# Patient Record
Sex: Female | Born: 1990 | Race: White | Hispanic: No | Marital: Single | Smoking: Never smoker
Health system: Southern US, Community
[De-identification: ages and names within clinical notes are randomized; demographics above are authoritative.]

## PROBLEM LIST (undated history)

## (undated) DIAGNOSIS — I10 Essential (primary) hypertension: Secondary | ICD-10-CM

## (undated) HISTORY — DX: Essential (primary) hypertension: I10

---
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
## 2011-04-12 ENCOUNTER — Encounter: Payer: Self-pay | Admitting: *Deleted

## 2011-06-02 ENCOUNTER — Other Ambulatory Visit: Payer: Self-pay | Admitting: "Endocrinology

## 2018-05-10 DIAGNOSIS — Q213 Tetralogy of Fallot: Secondary | ICD-10-CM | POA: Diagnosis not present

## 2018-06-23 DIAGNOSIS — Q220 Pulmonary valve atresia: Secondary | ICD-10-CM | POA: Diagnosis not present

## 2018-06-23 DIAGNOSIS — Q213 Tetralogy of Fallot: Secondary | ICD-10-CM | POA: Diagnosis not present

## 2018-06-23 DIAGNOSIS — F419 Anxiety disorder, unspecified: Secondary | ICD-10-CM | POA: Diagnosis not present

## 2018-06-25 DIAGNOSIS — I499 Cardiac arrhythmia, unspecified: Secondary | ICD-10-CM | POA: Diagnosis not present

## 2018-07-25 DIAGNOSIS — J069 Acute upper respiratory infection, unspecified: Secondary | ICD-10-CM | POA: Diagnosis not present

## 2018-08-03 ENCOUNTER — Ambulatory Visit: Payer: BLUE CROSS/BLUE SHIELD | Admitting: Psychology

## 2018-08-05 ENCOUNTER — Encounter

## 2018-08-05 ENCOUNTER — Ambulatory Visit (HOSPITAL_COMMUNITY): Payer: Self-pay | Admitting: Psychiatry

## 2018-08-22 ENCOUNTER — Ambulatory Visit: Payer: BLUE CROSS/BLUE SHIELD | Admitting: Psychology

## 2018-09-08 DIAGNOSIS — L820 Inflamed seborrheic keratosis: Secondary | ICD-10-CM | POA: Diagnosis not present

## 2018-09-08 DIAGNOSIS — D224 Melanocytic nevi of scalp and neck: Secondary | ICD-10-CM | POA: Diagnosis not present

## 2018-09-08 DIAGNOSIS — L918 Other hypertrophic disorders of the skin: Secondary | ICD-10-CM | POA: Diagnosis not present

## 2018-09-08 DIAGNOSIS — B351 Tinea unguium: Secondary | ICD-10-CM | POA: Diagnosis not present

## 2018-10-17 ENCOUNTER — Ambulatory Visit: Payer: Self-pay | Admitting: Nurse Practitioner

## 2018-10-17 VITALS — BP 98/72 | HR 99 | Temp 99.0°F | Wt 86.0 lb

## 2018-10-17 DIAGNOSIS — Z20828 Contact with and (suspected) exposure to other viral communicable diseases: Secondary | ICD-10-CM

## 2018-10-17 DIAGNOSIS — R6889 Other general symptoms and signs: Secondary | ICD-10-CM

## 2018-10-17 LAB — POCT INFLUENZA A/B
Influenza A, POC: NEGATIVE
Influenza B, POC: NEGATIVE

## 2018-10-17 MED ORDER — OSELTAMIVIR PHOSPHATE 75 MG PO CAPS: 75.0000 mg | ORAL_CAPSULE | Freq: Two times a day (BID) | ORAL | 0 refills | Status: AC

## 2018-10-17 NOTE — Patient Instructions (Signed)
Influenza-Like Symptoms, Adult -Take medication as prescribed. -Ibuprofen or Tylenol for pain, fever, or general discomfort. -Increase fluids. -Sleep elevated on at least 2 pillows at bedtime to help with cough. -Use a humidifier or vaporizer when at home and during sleep to help with cough. -May use a teaspoon of honey or over-the-counter cough drops to help with cough. -Follow-up with PCP if symptoms do not improve.   Influenza, more commonly known as "the flu," is a viral infection that mainly affects the respiratory tract. The respiratory tract includes organs that help you breathe, such as the lungs, nose, and throat. The flu causes many symptoms similar to the common cold along with high fever and body aches. The flu spreads easily from person to person (is contagious). Getting a flu shot (influenza vaccination) every year is the best way to prevent the flu. What are the causes? This condition is caused by the influenza virus. You can get the virus by:  Breathing in droplets that are in the air from an infected person's cough or sneeze.  Touching something that has been exposed to the virus (has been contaminated) and then touching your mouth, nose, or eyes. What increases the risk? The following factors may make you more likely to get the flu:  Not washing or sanitizing your hands often.  Having close contact with many people during cold and flu season.  Touching your mouth, eyes, or nose without first washing or sanitizing your hands.  Not getting a yearly (annual) flu shot. You may have a higher risk for the flu, including serious problems such as a lung infection (pneumonia), if you:  Are older than 65.  Are pregnant.  Have a weakened disease-fighting system (immune system). You may have a weakened immune system if you: ? Have HIV or AIDS. ? Are undergoing chemotherapy. ? Are taking medicines that reduce (suppress) the activity of your immune system.  Have a long-term  (chronic) illness, such as heart disease, kidney disease, diabetes, or lung disease.  Have a liver disorder.  Are severely overweight (morbidly obese).  Have anemia. This is a condition that affects your red blood cells.  Have asthma. What are the signs or symptoms? Symptoms of this condition usually begin suddenly and last 4-14 days. They may include:  Fever and chills.  Headaches, body aches, or muscle aches.  Sore throat.  Cough.  Runny or stuffy (congested) nose.  Chest discomfort.  Poor appetite.  Weakness or fatigue.  Dizziness.  Nausea or vomiting. How is this diagnosed? This condition may be diagnosed based on:  Your symptoms and medical history.  A physical exam.  Swabbing your nose or throat and testing the fluid for the influenza virus. How is this treated? If the flu is diagnosed early, you can be treated with medicine that can help reduce how severe the illness is and how long it lasts (antiviral medicine). This may be given by mouth (orally) or through an IV. Taking care of yourself at home can help relieve symptoms. Your health care provider may recommend:  Taking over-the-counter medicines.  Drinking plenty of fluids. In many cases, the flu goes away on its own. If you have severe symptoms or complications, you may be treated in a hospital. Follow these instructions at home: Activity  Rest as needed and get plenty of sleep.  Stay home from work or school as told by your health care provider. Unless you are visiting your health care provider, avoid leaving home until your fever has been  gone for 24 hours without taking medicine. Eating and drinking  Take an oral rehydration solution (ORS). This is a drink that is sold at pharmacies and retail stores.  Drink enough fluid to keep your urine pale yellow.  Drink clear fluids in small amounts as you are able. Clear fluids include water, ice chips, diluted fruit juice, and low-calorie sports  drinks.  Eat bland, easy-to-digest foods in small amounts as you are able. These foods include bananas, applesauce, rice, lean meats, toast, and crackers.  Avoid drinking fluids that contain a lot of sugar or caffeine, such as energy drinks, regular sports drinks, and soda.  Avoid alcohol.  Avoid spicy or fatty foods. General instructions      Take over-the-counter and prescription medicines only as told by your health care provider.  Use a cool mist humidifier to add humidity to the air in your home. This can make it easier to breathe.  Cover your mouth and nose when you cough or sneeze.  Wash your hands with soap and water often, especially after you cough or sneeze. If soap and water are not available, use alcohol-based hand sanitizer.  Keep all follow-up visits as told by your health care provider. This is important. How is this prevented?   Get an annual flu shot. You may get the flu shot in late summer, fall, or winter. Ask your health care provider when you should get your flu shot.  Avoid contact with people who are sick during cold and flu season. This is generally fall and winter. Contact a health care provider if:  You develop new symptoms.  You have: ? Chest pain. ? Diarrhea. ? A fever.  Your cough gets worse.  You produce more mucus.  You feel nauseous or you vomit. Get help right away if:  You develop shortness of breath or difficulty breathing.  Your skin or nails turn a bluish color.  You have severe pain or stiffness in your neck.  You develop a sudden headache or sudden pain in your face or ear.  You cannot eat or drink without vomiting. Summary  Influenza, more commonly known as "the flu," is a viral infection that primarily affects your respiratory tract.  Symptoms of the flu usually begin suddenly and last 4-14 days.  Getting an annual flu shot is the best way to prevent getting the flu.  Stay home from work or school as told by your  health care provider. Unless you are visiting your health care provider, avoid leaving home until your fever has been gone for 24 hours without taking medicine.  Keep all follow-up visits as told by your health care provider. This is important. This information is not intended to replace advice given to you by your health care provider. Make sure you discuss any questions you have with your health care provider. Document Released: 10/08/2000 Document Revised: 03/29/2018 Document Reviewed: 03/29/2018 Elsevier Interactive Patient Education  2019 [employer]

## 2018-10-17 NOTE — Addendum Note (Signed)
Addended by: Arnette FeltsRSETT, Morrisa Aldaba M on: 10/17/2018 11:46 AM   Modules accepted: Orders

## 2018-10-17 NOTE — Progress Notes (Signed)
Subjective:  Courtney Mckay is a 27 y.o. female who presents for evaluation of influenza like symptoms.  Symptoms include achiness, cough described as nonproductive, fever: fevers up to 100.4 degrees, sore throat and headache.  Onset of symptoms was 1 day ago, and has been unchanged since that time.  Treatment to date:  Dayquil.  High risk factors for influenza complications:  co-morbid illness and patient has a history of Tetralogy of Fallot and VSD.  The following portions of the patient's history were reviewed and updated as appropriate:  allergies, current medications and past medical history.  Constitutional: positive for chills, fatigue, fevers and malaise, negative for anorexia, sweats and weight loss Eyes: negative Ears, nose, mouth, throat, and face: positive for nasal congestion and sore throat, negative for ear drainage, earaches and hoarseness Respiratory: positive for cough, negative for asthma, chronic bronchitis, dyspnea on exertion, sputum, stridor and wheezing Cardiovascular: negative Gastrointestinal: negative Neurological: positive for headaches, negative for coordination problems, dizziness, paresthesia, tremors, vertigo and weakness Objective:  BP 98/72 (BP Location: Right Arm, Patient Position: Sitting)   Pulse 99   Temp 99 F (37.2 C) (Oral)   Wt 86 lb (39 kg)   SpO2 99%  General appearance: alert, cooperative and no distress Head: Normocephalic, without obvious abnormality, atraumatic Eyes: conjunctivae/corneas clear. PERRL, EOM's intact. Fundi benign. Ears: normal TM's and external ear canals both ears Nose: no discharge, moderate congestion, turbinates swollen, inflamed, no sinus tenderness Throat: abnormal findings: moderate oropharyngeal erythema Lungs: clear to auscultation bilaterally Heart: regular rate and rhythm, S1, S2 normal, no murmur, click, rub or gallop Abdomen: soft, non-tender; bowel sounds normal; no masses,  no organomegaly Pulses: 2+ and  symmetric Skin: +pallor (due to underlying heart disease), no lesions, texture and turgor normal Lymph nodes: cervical and submandibular nodes normal Neurologic: Grossly normal    Assessment:  influenza like syndrome    Plan:   Exam findings, diagnosis etiology and medication use and indications reviewed with patient. Follow- Up and discharge instructions provided. No emergent/urgent issues found on exam.  Based on the patient's past medical history, and symptoms, and comorbidity of heart disease, we will go ahead and treat prophylactically with Tamiflu.  Patient was in agreement with this treatment plan.  Patient will also continue symptomatic treatment.  Patient education was provided. Patient verbalized understanding of information provided and agrees with plan of care (POC), all questions answered. The patient is advised to call or return to clinic if condition does not see an improvement in symptoms, or to seek the care of the closest emergency department if condition worsens with the above plan.   1. Exposure to influenza  - oseltamivir (TAMIFLU) 75 MG capsule; Take 1 capsule (75 mg total) by mouth 2 (two) times daily for 5 days.  Dispense: 10 capsule; Refill: 0 -Take medication as prescribed. -Ibuprofen or Tylenol for pain, fever, or general discomfort. -Increase fluids. -Sleep elevated on at least 2 pillows at bedtime to help with cough. -Use a humidifier or vaporizer when at home and during sleep to help with cough. -May use a teaspoon of honey or over-the-counter cough drops to help with cough. -Follow-up with PCP if symptoms do not improve.  2. Influenza-like symptoms  - oseltamivir (TAMIFLU) 75 MG capsule; Take 1 capsule (75 mg total) by mouth 2 (two) times daily for 5 days.  Dispense: 10 capsule; Refill: 0 -Take medication as prescribed. -Ibuprofen or Tylenol for pain, fever, or general discomfort. -Increase fluids. -Sleep elevated on at least 2 pillows  at bedtime to help  with cough. -Use a humidifier or vaporizer when at home and during sleep to help with cough. -May use a teaspoon of honey or over-the-counter cough drops to help with cough. -Follow-up with PCP if symptoms do not improve.

## 2018-11-17 DIAGNOSIS — F411 Generalized anxiety disorder: Secondary | ICD-10-CM | POA: Diagnosis not present

## 2018-12-19 DIAGNOSIS — F4011 Social phobia, generalized: Secondary | ICD-10-CM | POA: Diagnosis not present

## 2019-03-12 DIAGNOSIS — R5381 Other malaise: Secondary | ICD-10-CM | POA: Diagnosis not present

## 2019-04-24 DIAGNOSIS — F4011 Social phobia, generalized: Secondary | ICD-10-CM | POA: Diagnosis not present

## 2019-05-03 DIAGNOSIS — Q213 Tetralogy of Fallot: Secondary | ICD-10-CM | POA: Diagnosis not present

## 2019-05-03 DIAGNOSIS — Q249 Congenital malformation of heart, unspecified: Secondary | ICD-10-CM | POA: Diagnosis not present

## 2019-05-03 DIAGNOSIS — D821 Di George's syndrome: Secondary | ICD-10-CM | POA: Diagnosis not present

## 2019-05-03 DIAGNOSIS — R011 Cardiac murmur, unspecified: Secondary | ICD-10-CM | POA: Diagnosis not present

## 2019-05-03 DIAGNOSIS — I77819 Aortic ectasia, unspecified site: Secondary | ICD-10-CM | POA: Diagnosis not present

## 2019-05-03 DIAGNOSIS — J9811 Atelectasis: Secondary | ICD-10-CM | POA: Diagnosis not present

## 2019-05-03 DIAGNOSIS — Q2547 Right aortic arch: Secondary | ICD-10-CM | POA: Diagnosis not present

## 2019-05-03 DIAGNOSIS — Z9889 Other specified postprocedural states: Secondary | ICD-10-CM | POA: Diagnosis not present

## 2019-05-17 DIAGNOSIS — F4011 Social phobia, generalized: Secondary | ICD-10-CM | POA: Diagnosis not present

## 2019-05-30 DIAGNOSIS — F411 Generalized anxiety disorder: Secondary | ICD-10-CM | POA: Diagnosis not present

## 2019-06-06 DIAGNOSIS — F411 Generalized anxiety disorder: Secondary | ICD-10-CM | POA: Diagnosis not present

## 2019-06-13 DIAGNOSIS — F411 Generalized anxiety disorder: Secondary | ICD-10-CM | POA: Diagnosis not present

## 2019-06-19 DIAGNOSIS — F411 Generalized anxiety disorder: Secondary | ICD-10-CM | POA: Diagnosis not present

## 2019-06-20 DIAGNOSIS — F4011 Social phobia, generalized: Secondary | ICD-10-CM | POA: Diagnosis not present

## 2019-07-03 DIAGNOSIS — F411 Generalized anxiety disorder: Secondary | ICD-10-CM | POA: Diagnosis not present

## 2019-07-19 DIAGNOSIS — F411 Generalized anxiety disorder: Secondary | ICD-10-CM | POA: Diagnosis not present

## 2019-07-26 DIAGNOSIS — F411 Generalized anxiety disorder: Secondary | ICD-10-CM | POA: Diagnosis not present

## 2019-08-02 DIAGNOSIS — Z681 Body mass index (BMI) 19 or less, adult: Secondary | ICD-10-CM | POA: Diagnosis not present

## 2019-08-02 DIAGNOSIS — Z01419 Encounter for gynecological examination (general) (routine) without abnormal findings: Secondary | ICD-10-CM | POA: Diagnosis not present

## 2019-08-02 DIAGNOSIS — Z23 Encounter for immunization: Secondary | ICD-10-CM | POA: Diagnosis not present

## 2019-08-15 DIAGNOSIS — F411 Generalized anxiety disorder: Secondary | ICD-10-CM | POA: Diagnosis not present

## 2019-10-04 DIAGNOSIS — F411 Generalized anxiety disorder: Secondary | ICD-10-CM | POA: Diagnosis not present

## 2019-11-15 DIAGNOSIS — F4011 Social phobia, generalized: Secondary | ICD-10-CM | POA: Diagnosis not present

## 2019-12-18 DIAGNOSIS — F411 Generalized anxiety disorder: Secondary | ICD-10-CM | POA: Diagnosis not present

## 2020-03-24 DIAGNOSIS — F4011 Social phobia, generalized: Secondary | ICD-10-CM | POA: Diagnosis not present

## 2020-05-27 DIAGNOSIS — F411 Generalized anxiety disorder: Secondary | ICD-10-CM | POA: Diagnosis not present

## 2020-06-18 DIAGNOSIS — L820 Inflamed seborrheic keratosis: Secondary | ICD-10-CM | POA: Diagnosis not present

## 2020-06-25 DIAGNOSIS — Q220 Pulmonary valve atresia: Secondary | ICD-10-CM | POA: Diagnosis not present

## 2020-06-25 DIAGNOSIS — D821 Di George's syndrome: Secondary | ICD-10-CM | POA: Diagnosis not present

## 2020-06-25 DIAGNOSIS — R002 Palpitations: Secondary | ICD-10-CM | POA: Diagnosis not present

## 2020-07-08 DIAGNOSIS — R002 Palpitations: Secondary | ICD-10-CM | POA: Diagnosis not present

## 2020-09-14 DIAGNOSIS — F4011 Social phobia, generalized: Secondary | ICD-10-CM | POA: Diagnosis not present

## 2020-10-22 DIAGNOSIS — U071 COVID-19: Secondary | ICD-10-CM | POA: Diagnosis not present

## 2020-10-22 DIAGNOSIS — R059 Cough, unspecified: Secondary | ICD-10-CM | POA: Diagnosis not present

## 2020-10-22 DIAGNOSIS — R509 Fever, unspecified: Secondary | ICD-10-CM | POA: Diagnosis not present

## 2020-10-22 DIAGNOSIS — J029 Acute pharyngitis, unspecified: Secondary | ICD-10-CM | POA: Diagnosis not present

## 2021-01-13 DIAGNOSIS — Z01419 Encounter for gynecological examination (general) (routine) without abnormal findings: Secondary | ICD-10-CM | POA: Diagnosis not present

## 2021-01-13 DIAGNOSIS — Z681 Body mass index (BMI) 19 or less, adult: Secondary | ICD-10-CM | POA: Diagnosis not present

## 2021-02-11 DIAGNOSIS — Q213 Tetralogy of Fallot: Secondary | ICD-10-CM | POA: Diagnosis not present

## 2021-02-11 DIAGNOSIS — Q249 Congenital malformation of heart, unspecified: Secondary | ICD-10-CM | POA: Diagnosis not present

## 2021-02-11 DIAGNOSIS — Q210 Ventricular septal defect: Secondary | ICD-10-CM | POA: Diagnosis not present

## 2021-02-11 DIAGNOSIS — I351 Nonrheumatic aortic (valve) insufficiency: Secondary | ICD-10-CM | POA: Diagnosis not present

## 2021-02-11 DIAGNOSIS — Q255 Atresia of pulmonary artery: Secondary | ICD-10-CM | POA: Diagnosis not present

## 2021-04-03 DIAGNOSIS — I351 Nonrheumatic aortic (valve) insufficiency: Secondary | ICD-10-CM | POA: Diagnosis not present

## 2021-04-03 DIAGNOSIS — Q213 Tetralogy of Fallot: Secondary | ICD-10-CM | POA: Diagnosis not present

## 2021-04-03 DIAGNOSIS — Q249 Congenital malformation of heart, unspecified: Secondary | ICD-10-CM | POA: Diagnosis not present

## 2021-04-29 DIAGNOSIS — Q213 Tetralogy of Fallot: Secondary | ICD-10-CM | POA: Diagnosis not present

## 2021-04-29 DIAGNOSIS — I5020 Unspecified systolic (congestive) heart failure: Secondary | ICD-10-CM | POA: Diagnosis not present

## 2021-04-29 DIAGNOSIS — I517 Cardiomegaly: Secondary | ICD-10-CM | POA: Diagnosis not present

## 2021-04-29 DIAGNOSIS — I351 Nonrheumatic aortic (valve) insufficiency: Secondary | ICD-10-CM | POA: Diagnosis not present

## 2021-07-15 DIAGNOSIS — Q213 Tetralogy of Fallot: Secondary | ICD-10-CM | POA: Diagnosis not present

## 2021-07-15 DIAGNOSIS — I352 Nonrheumatic aortic (valve) stenosis with insufficiency: Secondary | ICD-10-CM | POA: Diagnosis not present

## 2021-07-15 DIAGNOSIS — Z955 Presence of coronary angioplasty implant and graft: Secondary | ICD-10-CM | POA: Diagnosis not present

## 2021-07-15 DIAGNOSIS — I351 Nonrheumatic aortic (valve) insufficiency: Secondary | ICD-10-CM | POA: Diagnosis not present

## 2021-07-16 DIAGNOSIS — T82857A Stenosis of cardiac prosthetic devices, implants and grafts, initial encounter: Secondary | ICD-10-CM | POA: Diagnosis not present

## 2021-07-16 DIAGNOSIS — Z955 Presence of coronary angioplasty implant and graft: Secondary | ICD-10-CM | POA: Diagnosis not present

## 2021-07-16 DIAGNOSIS — Q2579 Other congenital malformations of pulmonary artery: Secondary | ICD-10-CM | POA: Diagnosis not present

## 2021-07-16 DIAGNOSIS — Q213 Tetralogy of Fallot: Secondary | ICD-10-CM | POA: Diagnosis not present

## 2021-07-16 DIAGNOSIS — Q255 Atresia of pulmonary artery: Secondary | ICD-10-CM | POA: Diagnosis not present

## 2021-07-16 DIAGNOSIS — Z95828 Presence of other vascular implants and grafts: Secondary | ICD-10-CM | POA: Diagnosis not present

## 2021-07-16 DIAGNOSIS — I4510 Unspecified right bundle-branch block: Secondary | ICD-10-CM | POA: Diagnosis not present

## 2021-07-16 DIAGNOSIS — J811 Chronic pulmonary edema: Secondary | ICD-10-CM | POA: Diagnosis not present

## 2021-07-16 DIAGNOSIS — Z8249 Family history of ischemic heart disease and other diseases of the circulatory system: Secondary | ICD-10-CM | POA: Diagnosis not present

## 2021-07-16 DIAGNOSIS — D821 Di George's syndrome: Secondary | ICD-10-CM | POA: Diagnosis not present

## 2021-07-16 DIAGNOSIS — I351 Nonrheumatic aortic (valve) insufficiency: Secondary | ICD-10-CM | POA: Diagnosis not present

## 2021-08-20 DIAGNOSIS — Z1331 Encounter for screening for depression: Secondary | ICD-10-CM | POA: Diagnosis not present

## 2021-08-20 DIAGNOSIS — I10 Essential (primary) hypertension: Secondary | ICD-10-CM | POA: Diagnosis not present

## 2022-01-28 DIAGNOSIS — Z681 Body mass index (BMI) 19 or less, adult: Secondary | ICD-10-CM | POA: Diagnosis not present

## 2022-01-28 DIAGNOSIS — Z01419 Encounter for gynecological examination (general) (routine) without abnormal findings: Secondary | ICD-10-CM | POA: Diagnosis not present

## 2022-01-28 DIAGNOSIS — Z124 Encounter for screening for malignant neoplasm of cervix: Secondary | ICD-10-CM | POA: Diagnosis not present

## 2022-02-15 DIAGNOSIS — M79642 Pain in left hand: Secondary | ICD-10-CM | POA: Diagnosis not present

## 2022-02-15 DIAGNOSIS — M79641 Pain in right hand: Secondary | ICD-10-CM | POA: Diagnosis not present

## 2022-02-15 DIAGNOSIS — G5603 Carpal tunnel syndrome, bilateral upper limbs: Secondary | ICD-10-CM | POA: Diagnosis not present

## 2022-03-03 DIAGNOSIS — R202 Paresthesia of skin: Secondary | ICD-10-CM | POA: Diagnosis not present

## 2022-03-08 DIAGNOSIS — G5603 Carpal tunnel syndrome, bilateral upper limbs: Secondary | ICD-10-CM | POA: Diagnosis not present

## 2022-03-08 DIAGNOSIS — G5602 Carpal tunnel syndrome, left upper limb: Secondary | ICD-10-CM | POA: Diagnosis not present

## 2022-03-08 DIAGNOSIS — G5601 Carpal tunnel syndrome, right upper limb: Secondary | ICD-10-CM | POA: Diagnosis not present

## 2022-03-26 DIAGNOSIS — Q255 Atresia of pulmonary artery: Secondary | ICD-10-CM | POA: Diagnosis not present

## 2022-03-26 DIAGNOSIS — Z8774 Personal history of (corrected) congenital malformations of heart and circulatory system: Secondary | ICD-10-CM | POA: Diagnosis not present

## 2022-03-26 DIAGNOSIS — Q245 Malformation of coronary vessels: Secondary | ICD-10-CM | POA: Diagnosis not present

## 2022-03-26 DIAGNOSIS — I351 Nonrheumatic aortic (valve) insufficiency: Secondary | ICD-10-CM | POA: Diagnosis not present

## 2022-03-26 DIAGNOSIS — D821 Di George's syndrome: Secondary | ICD-10-CM | POA: Diagnosis not present

## 2022-03-26 DIAGNOSIS — Q213 Tetralogy of Fallot: Secondary | ICD-10-CM | POA: Diagnosis not present

## 2022-04-01 DIAGNOSIS — M79642 Pain in left hand: Secondary | ICD-10-CM | POA: Diagnosis not present

## 2022-04-01 DIAGNOSIS — M79641 Pain in right hand: Secondary | ICD-10-CM | POA: Diagnosis not present

## 2022-04-07 DIAGNOSIS — M79641 Pain in right hand: Secondary | ICD-10-CM | POA: Diagnosis not present

## 2022-04-07 DIAGNOSIS — K900 Celiac disease: Secondary | ICD-10-CM | POA: Diagnosis not present

## 2022-04-07 DIAGNOSIS — G43009 Migraine without aura, not intractable, without status migrainosus: Secondary | ICD-10-CM | POA: Diagnosis not present

## 2022-04-07 DIAGNOSIS — M79642 Pain in left hand: Secondary | ICD-10-CM | POA: Diagnosis not present

## 2022-06-25 ENCOUNTER — Emergency Department (HOSPITAL_BASED_OUTPATIENT_CLINIC_OR_DEPARTMENT_OTHER): Payer: BC Managed Care – PPO | Admitting: Radiology

## 2022-06-25 ENCOUNTER — Other Ambulatory Visit: Payer: Self-pay

## 2022-06-25 ENCOUNTER — Emergency Department (HOSPITAL_BASED_OUTPATIENT_CLINIC_OR_DEPARTMENT_OTHER)
Admission: EM | Admit: 2022-06-25 | Discharge: 2022-06-26 | Disposition: A | Discharge status: Home / Self Care | Payer: BC Managed Care – PPO | Attending: Emergency Medicine | Admitting: Emergency Medicine

## 2022-06-25 ENCOUNTER — Encounter (HOSPITAL_BASED_OUTPATIENT_CLINIC_OR_DEPARTMENT_OTHER): Payer: Self-pay

## 2022-06-25 DIAGNOSIS — T17998A Other foreign object in respiratory tract, part unspecified causing other injury, initial encounter: Secondary | ICD-10-CM

## 2022-06-25 DIAGNOSIS — R0602 Shortness of breath: Secondary | ICD-10-CM

## 2022-06-25 DIAGNOSIS — J029 Acute pharyngitis, unspecified: Secondary | ICD-10-CM

## 2022-06-25 DIAGNOSIS — J9809 Other diseases of bronchus, not elsewhere classified: Secondary | ICD-10-CM | POA: Diagnosis not present

## 2022-06-25 DIAGNOSIS — R011 Cardiac murmur, unspecified: Secondary | ICD-10-CM | POA: Insufficient documentation

## 2022-06-25 DIAGNOSIS — I10 Essential (primary) hypertension: Secondary | ICD-10-CM | POA: Insufficient documentation

## 2022-06-25 DIAGNOSIS — R0981 Nasal congestion: Secondary | ICD-10-CM | POA: Insufficient documentation

## 2022-06-25 DIAGNOSIS — Z20822 Contact with and (suspected) exposure to covid-19: Secondary | ICD-10-CM | POA: Insufficient documentation

## 2022-06-25 DIAGNOSIS — R059 Cough, unspecified: Secondary | ICD-10-CM | POA: Diagnosis not present

## 2022-06-25 HISTORY — DX: Essential (primary) hypertension: I10

## 2022-06-25 LAB — RESP PANEL BY RT-PCR (FLU A&B, COVID) ARPGX2
Influenza A by PCR: NEGATIVE
Influenza B by PCR: NEGATIVE
SARS Coronavirus 2 by RT PCR: NEGATIVE

## 2022-06-25 LAB — GROUP A STREP BY PCR: Group A Strep by PCR: NOT DETECTED

## 2022-06-25 NOTE — Progress Notes (Signed)
Patient instructed with Flutter valve and incentive spirometer.  Patient had good technique, effort and understanding of both devices.  Family at bedside for instruction as well.

## 2022-06-25 NOTE — ED Triage Notes (Addendum)
Patient here POV from Home.  Endorses Sore Throat for approximately 6 Days. Associated with Cough. States her BP Medication normally makes her cough when taking it but today when she took it she felt as if it became lodged in her throat and she became anxious and SOB. No Oral or Airway Occlusion at this Time but her Voice is Hoarse and it is more painful then previously. Speaking in Complete Sentences at this Time.   History of VSD.  NAD Noted during Triage. A&Ox4. GCS 15. Ambulatory.

## 2022-06-25 NOTE — ED Provider Notes (Signed)
[facility] EMERGENCY DEPT Provider Note  CSN: [id number] Arrival date & time: 06/25/22 1932  Chief Complaint(s) Sore Throat  HPI Courtney Mckay is a 31 y.o. female {Add pertinent medical, surgical, social history, OB history to HPI:1} h/o TOF w/ VSD  The history is provided by the patient.    Past Medical History Past Medical History:  Diagnosis Date   Hypertension    There are no problems to display for this patient.  Home Medication(s) Prior to Admission medications   Not on File                                                                                                                                    Allergies Morphine  Review of Systems Review of Systems As noted in HPI  Physical Exam Vital Signs  I have reviewed the triage vital signs BP (!) 112/53   Pulse 86   Temp 98.4 F (36.9 C) (Oral)   Resp 18   Ht 5\' 2"  (1.575 m)   Wt 45.4 kg   SpO2 96%   BMI 18.29 kg/m  *** Physical Exam  ED Results and Treatments Labs (all labs ordered are listed, but only abnormal results are displayed) Labs Reviewed  GROUP A STREP BY PCR  RESP PANEL BY RT-PCR (FLU A&B, COVID) [id number]                                                                                                                         EKG  EKG Interpretation  Date/Time:    Ventricular Rate:    PR Interval:    QRS Duration:   QT Interval:    QTC Calculation:   R Axis:     Text Interpretation:         Radiology DG Neck Soft Tissue  Result Date: 06/25/2022 CLINICAL DATA:  Possible foreign body in throat. Sore throat and cough. EXAM: NECK SOFT TISSUES - 1+ VIEW COMPARISON:  None Available. FINDINGS: There is no evidence of retropharyngeal soft tissue swelling or epiglottic enlargement. The cervical airway is unremarkable. No radio-opaque foreign body identified. Non-suspicious soft tissue planes. No upper pneumomediastinum. IMPRESSION: Negative soft tissue neck radiographs. No  visible radiopaque foreign body. Electronically Signed   By: 08/25/2022 M.D.   On: 06/25/2022 21:07   DG Chest 2 View  Result Date: 06/25/2022 CLINICAL DATA:  Shortness of breath. Cough. Review of  care everywhere indicates history of congenital heart disease. EXAM: CHEST - 2 VIEW COMPARISON:  None Available. FINDINGS: Remote median sternotomy. The heart is enlarged. Lobulated upper mediastinal contours, likely sequela of congenital heart disease. Possible graft in the anterior mediastinum. Vascular stent in the left mediastinum. Lobulated right hilar density likely represents an enlarged right pulmonary artery. Mild scarring at the left lung base. No evidence of acute airspace disease. No pulmonary edema. No pleural effusion or pneumothorax. Posterior rod with pedicle hook fixation of the thoracic spine. IMPRESSION: 1. Remote median sternotomy with cardiomegaly. Patient with history of congenital heart disease, no prior exams available for comparison. 2. Right hilar density likely represents an enlarged right pulmonary artery. 3. No focal pulmonary opacity or pulmonary edema. Electronically Signed   By: Narda Rutherford M.D.   On: 06/25/2022 21:05    Medications Ordered in ED Medications - No data to display                                                                                                                                   Procedures Procedures  (including critical care time)  Medical Decision Making / ED Course   Medical Decision Making         Final Clinical Impression(s) / ED Diagnoses Final diagnoses:  None    {Document critical care time when appropriate:1}  {Document review of labs and clinical decision tools ie heart score, Chads2Vasc2 etc:1}  {Document your independent review of radiology images, and any outside records:1} {Document your discussion with family members, caretakers, and with consultants:1} {Document social determinants of health affecting pt's  care:1} {Document your decision making why or why not admission, treatments were needed:1} This chart was dictated using voice recognition software.  Despite best efforts to proofread,  errors can occur which can change the documentation meaning.

## 2022-08-24 DIAGNOSIS — J309 Allergic rhinitis, unspecified: Secondary | ICD-10-CM | POA: Diagnosis not present

## 2022-08-24 DIAGNOSIS — Z1331 Encounter for screening for depression: Secondary | ICD-10-CM | POA: Diagnosis not present

## 2022-08-24 DIAGNOSIS — Z1339 Encounter for screening examination for other mental health and behavioral disorders: Secondary | ICD-10-CM | POA: Diagnosis not present

## 2022-08-24 DIAGNOSIS — I10 Essential (primary) hypertension: Secondary | ICD-10-CM | POA: Diagnosis not present

## 2023-06-15 DIAGNOSIS — Z8774 Personal history of (corrected) congenital malformations of heart and circulatory system: Secondary | ICD-10-CM | POA: Diagnosis not present

## 2023-06-15 DIAGNOSIS — Z950 Presence of cardiac pacemaker: Secondary | ICD-10-CM | POA: Diagnosis not present

## 2023-06-15 DIAGNOSIS — I77810 Thoracic aortic ectasia: Secondary | ICD-10-CM | POA: Diagnosis not present

## 2023-06-15 DIAGNOSIS — Q213 Tetralogy of Fallot: Secondary | ICD-10-CM | POA: Diagnosis not present

## 2023-06-15 DIAGNOSIS — R9431 Abnormal electrocardiogram [ECG] [EKG]: Secondary | ICD-10-CM | POA: Diagnosis not present

## 2023-06-15 DIAGNOSIS — I071 Rheumatic tricuspid insufficiency: Secondary | ICD-10-CM | POA: Diagnosis not present

## 2023-06-15 DIAGNOSIS — Z95818 Presence of other cardiac implants and grafts: Secondary | ICD-10-CM | POA: Diagnosis not present

## 2023-06-16 DIAGNOSIS — I071 Rheumatic tricuspid insufficiency: Secondary | ICD-10-CM | POA: Diagnosis not present

## 2023-06-16 DIAGNOSIS — Z95818 Presence of other cardiac implants and grafts: Secondary | ICD-10-CM | POA: Diagnosis not present

## 2023-06-16 DIAGNOSIS — Z8774 Personal history of (corrected) congenital malformations of heart and circulatory system: Secondary | ICD-10-CM | POA: Diagnosis not present

## 2023-06-16 DIAGNOSIS — I77810 Thoracic aortic ectasia: Secondary | ICD-10-CM | POA: Diagnosis not present

## 2023-07-04 DIAGNOSIS — Z124 Encounter for screening for malignant neoplasm of cervix: Secondary | ICD-10-CM | POA: Diagnosis not present

## 2023-07-04 DIAGNOSIS — Z01419 Encounter for gynecological examination (general) (routine) without abnormal findings: Secondary | ICD-10-CM | POA: Diagnosis not present

## 2023-07-04 DIAGNOSIS — Z681 Body mass index (BMI) 19 or less, adult: Secondary | ICD-10-CM | POA: Diagnosis not present

## 2023-08-22 DIAGNOSIS — Q245 Malformation of coronary vessels: Secondary | ICD-10-CM | POA: Diagnosis not present

## 2023-08-22 DIAGNOSIS — I088 Other rheumatic multiple valve diseases: Secondary | ICD-10-CM | POA: Diagnosis not present

## 2023-08-22 DIAGNOSIS — Q213 Tetralogy of Fallot: Secondary | ICD-10-CM | POA: Diagnosis not present

## 2023-08-22 DIAGNOSIS — I7121 Aneurysm of the ascending aorta, without rupture: Secondary | ICD-10-CM | POA: Diagnosis not present

## 2023-08-25 DIAGNOSIS — R946 Abnormal results of thyroid function studies: Secondary | ICD-10-CM | POA: Diagnosis not present

## 2023-08-25 DIAGNOSIS — Z0189 Encounter for other specified special examinations: Secondary | ICD-10-CM | POA: Diagnosis not present

## 2023-08-25 DIAGNOSIS — Z0000 Encounter for general adult medical examination without abnormal findings: Secondary | ICD-10-CM | POA: Diagnosis not present

## 2023-08-25 DIAGNOSIS — Z1389 Encounter for screening for other disorder: Secondary | ICD-10-CM | POA: Diagnosis not present

## 2023-08-31 DIAGNOSIS — R0602 Shortness of breath: Secondary | ICD-10-CM | POA: Diagnosis not present

## 2023-08-31 DIAGNOSIS — R0683 Snoring: Secondary | ICD-10-CM | POA: Diagnosis not present

## 2023-09-01 DIAGNOSIS — E039 Hypothyroidism, unspecified: Secondary | ICD-10-CM | POA: Diagnosis not present

## 2023-09-01 DIAGNOSIS — Z1339 Encounter for screening examination for other mental health and behavioral disorders: Secondary | ICD-10-CM | POA: Diagnosis not present

## 2023-09-01 DIAGNOSIS — Z0000 Encounter for general adult medical examination without abnormal findings: Secondary | ICD-10-CM | POA: Diagnosis not present

## 2023-09-01 DIAGNOSIS — Z1331 Encounter for screening for depression: Secondary | ICD-10-CM | POA: Diagnosis not present

## 2023-09-07 DIAGNOSIS — D821 Di George's syndrome: Secondary | ICD-10-CM | POA: Diagnosis not present

## 2023-09-07 DIAGNOSIS — Q245 Malformation of coronary vessels: Secondary | ICD-10-CM | POA: Diagnosis not present

## 2023-09-07 DIAGNOSIS — Q2579 Other congenital malformations of pulmonary artery: Secondary | ICD-10-CM | POA: Diagnosis not present

## 2023-09-07 DIAGNOSIS — Q2547 Right aortic arch: Secondary | ICD-10-CM | POA: Diagnosis not present

## 2023-09-15 DIAGNOSIS — Q213 Tetralogy of Fallot: Secondary | ICD-10-CM | POA: Diagnosis not present

## 2023-09-28 DIAGNOSIS — J984 Other disorders of lung: Secondary | ICD-10-CM | POA: Diagnosis not present

## 2023-10-03 DIAGNOSIS — J984 Other disorders of lung: Secondary | ICD-10-CM | POA: Diagnosis not present

## 2023-10-03 DIAGNOSIS — M419 Scoliosis, unspecified: Secondary | ICD-10-CM | POA: Diagnosis not present

## 2023-10-03 DIAGNOSIS — J4489 Other specified chronic obstructive pulmonary disease: Secondary | ICD-10-CM | POA: Diagnosis not present

## 2023-10-21 DIAGNOSIS — Q213 Tetralogy of Fallot: Secondary | ICD-10-CM | POA: Diagnosis not present

## 2023-11-01 DIAGNOSIS — G4733 Obstructive sleep apnea (adult) (pediatric): Secondary | ICD-10-CM | POA: Diagnosis not present

## 2023-11-07 DIAGNOSIS — Q213 Tetralogy of Fallot: Secondary | ICD-10-CM | POA: Diagnosis not present

## 2023-11-07 DIAGNOSIS — G4733 Obstructive sleep apnea (adult) (pediatric): Secondary | ICD-10-CM | POA: Diagnosis not present

## 2023-11-14 DIAGNOSIS — Z4682 Encounter for fitting and adjustment of non-vascular catheter: Secondary | ICD-10-CM | POA: Diagnosis not present

## 2023-11-14 DIAGNOSIS — R918 Other nonspecific abnormal finding of lung field: Secondary | ICD-10-CM | POA: Diagnosis not present

## 2023-11-14 DIAGNOSIS — Q228 Other congenital malformations of tricuspid valve: Secondary | ICD-10-CM | POA: Diagnosis not present

## 2023-11-14 DIAGNOSIS — Z01810 Encounter for preprocedural cardiovascular examination: Secondary | ICD-10-CM | POA: Diagnosis not present

## 2023-11-14 DIAGNOSIS — I351 Nonrheumatic aortic (valve) insufficiency: Secondary | ICD-10-CM | POA: Diagnosis not present

## 2023-11-14 DIAGNOSIS — I110 Hypertensive heart disease with heart failure: Secondary | ICD-10-CM | POA: Diagnosis not present

## 2023-11-14 DIAGNOSIS — Q213 Tetralogy of Fallot: Secondary | ICD-10-CM | POA: Diagnosis not present

## 2023-11-14 DIAGNOSIS — I97418 Intraoperative hemorrhage and hematoma of a circulatory system organ or structure complicating other circulatory system procedure: Secondary | ICD-10-CM | POA: Diagnosis not present

## 2023-11-14 DIAGNOSIS — Z9889 Other specified postprocedural states: Secondary | ICD-10-CM | POA: Diagnosis not present

## 2023-11-14 DIAGNOSIS — Q2545 Double aortic arch: Secondary | ICD-10-CM | POA: Diagnosis not present

## 2023-11-14 DIAGNOSIS — R739 Hyperglycemia, unspecified: Secondary | ICD-10-CM | POA: Diagnosis not present

## 2023-11-14 DIAGNOSIS — K761 Chronic passive congestion of liver: Secondary | ICD-10-CM | POA: Diagnosis not present

## 2023-11-14 DIAGNOSIS — Z95818 Presence of other cardiac implants and grafts: Secondary | ICD-10-CM | POA: Diagnosis not present

## 2023-11-14 DIAGNOSIS — J9691 Respiratory failure, unspecified with hypoxia: Secondary | ICD-10-CM | POA: Diagnosis not present

## 2023-11-14 DIAGNOSIS — Z952 Presence of prosthetic heart valve: Secondary | ICD-10-CM | POA: Diagnosis not present

## 2023-11-14 DIAGNOSIS — D821 Di George's syndrome: Secondary | ICD-10-CM | POA: Diagnosis not present

## 2023-11-14 DIAGNOSIS — Q249 Congenital malformation of heart, unspecified: Secondary | ICD-10-CM | POA: Diagnosis not present

## 2023-11-14 DIAGNOSIS — R6510 Systemic inflammatory response syndrome (SIRS) of non-infectious origin without acute organ dysfunction: Secondary | ICD-10-CM | POA: Diagnosis not present

## 2023-11-14 DIAGNOSIS — Z794 Long term (current) use of insulin: Secondary | ICD-10-CM | POA: Diagnosis not present

## 2023-11-14 DIAGNOSIS — J4489 Other specified chronic obstructive pulmonary disease: Secondary | ICD-10-CM | POA: Diagnosis not present

## 2023-11-14 DIAGNOSIS — Z953 Presence of xenogenic heart valve: Secondary | ICD-10-CM | POA: Diagnosis not present

## 2023-11-14 DIAGNOSIS — Z481 Encounter for planned postprocedural wound closure: Secondary | ICD-10-CM | POA: Diagnosis not present

## 2023-11-14 DIAGNOSIS — Z954 Presence of other heart-valve replacement: Secondary | ICD-10-CM | POA: Diagnosis not present

## 2023-11-14 DIAGNOSIS — G4733 Obstructive sleep apnea (adult) (pediatric): Secondary | ICD-10-CM | POA: Diagnosis not present

## 2023-11-14 DIAGNOSIS — J45909 Unspecified asthma, uncomplicated: Secondary | ICD-10-CM | POA: Diagnosis not present

## 2023-11-14 DIAGNOSIS — J9859 Other diseases of mediastinum, not elsewhere classified: Secondary | ICD-10-CM | POA: Diagnosis not present

## 2023-11-14 DIAGNOSIS — R0989 Other specified symptoms and signs involving the circulatory and respiratory systems: Secondary | ICD-10-CM | POA: Diagnosis not present

## 2023-11-14 DIAGNOSIS — R0602 Shortness of breath: Secondary | ICD-10-CM | POA: Diagnosis not present

## 2023-11-14 DIAGNOSIS — I4510 Unspecified right bundle-branch block: Secondary | ICD-10-CM | POA: Diagnosis not present

## 2023-11-14 DIAGNOSIS — D696 Thrombocytopenia, unspecified: Secondary | ICD-10-CM | POA: Diagnosis not present

## 2023-11-14 DIAGNOSIS — I509 Heart failure, unspecified: Secondary | ICD-10-CM | POA: Diagnosis not present

## 2023-11-14 DIAGNOSIS — K7200 Acute and subacute hepatic failure without coma: Secondary | ICD-10-CM | POA: Diagnosis not present

## 2023-11-14 DIAGNOSIS — T82857A Stenosis of cardiac prosthetic devices, implants and grafts, initial encounter: Secondary | ICD-10-CM | POA: Diagnosis not present

## 2023-11-14 DIAGNOSIS — Z8774 Personal history of (corrected) congenital malformations of heart and circulatory system: Secondary | ICD-10-CM | POA: Diagnosis not present

## 2023-11-14 DIAGNOSIS — E872 Acidosis, unspecified: Secondary | ICD-10-CM | POA: Diagnosis not present

## 2023-11-14 DIAGNOSIS — I5022 Chronic systolic (congestive) heart failure: Secondary | ICD-10-CM | POA: Diagnosis not present

## 2023-11-14 DIAGNOSIS — J90 Pleural effusion, not elsewhere classified: Secondary | ICD-10-CM | POA: Diagnosis not present

## 2023-11-14 DIAGNOSIS — Q255 Atresia of pulmonary artery: Secondary | ICD-10-CM | POA: Diagnosis not present

## 2023-11-14 DIAGNOSIS — I77810 Thoracic aortic ectasia: Secondary | ICD-10-CM | POA: Diagnosis not present

## 2023-11-14 DIAGNOSIS — I5020 Unspecified systolic (congestive) heart failure: Secondary | ICD-10-CM | POA: Diagnosis not present

## 2023-11-14 DIAGNOSIS — Q2579 Other congenital malformations of pulmonary artery: Secondary | ICD-10-CM | POA: Diagnosis not present

## 2023-11-14 DIAGNOSIS — I517 Cardiomegaly: Secondary | ICD-10-CM | POA: Diagnosis not present

## 2023-11-14 DIAGNOSIS — I440 Atrioventricular block, first degree: Secondary | ICD-10-CM | POA: Diagnosis not present

## 2023-11-14 DIAGNOSIS — J9811 Atelectasis: Secondary | ICD-10-CM | POA: Diagnosis not present

## 2023-11-14 DIAGNOSIS — D689 Coagulation defect, unspecified: Secondary | ICD-10-CM | POA: Diagnosis not present

## 2023-11-14 DIAGNOSIS — Q210 Ventricular septal defect: Secondary | ICD-10-CM | POA: Diagnosis not present

## 2023-11-15 DIAGNOSIS — I4510 Unspecified right bundle-branch block: Secondary | ICD-10-CM | POA: Diagnosis not present

## 2023-11-15 DIAGNOSIS — Z952 Presence of prosthetic heart valve: Secondary | ICD-10-CM | POA: Diagnosis not present

## 2023-11-15 DIAGNOSIS — Q249 Congenital malformation of heart, unspecified: Secondary | ICD-10-CM | POA: Diagnosis not present

## 2023-11-15 DIAGNOSIS — D689 Coagulation defect, unspecified: Secondary | ICD-10-CM | POA: Diagnosis not present

## 2023-11-15 DIAGNOSIS — Z481 Encounter for planned postprocedural wound closure: Secondary | ICD-10-CM | POA: Diagnosis not present

## 2023-11-15 DIAGNOSIS — J4489 Other specified chronic obstructive pulmonary disease: Secondary | ICD-10-CM | POA: Diagnosis not present

## 2023-11-15 DIAGNOSIS — Z794 Long term (current) use of insulin: Secondary | ICD-10-CM | POA: Diagnosis not present

## 2023-11-15 DIAGNOSIS — Z95818 Presence of other cardiac implants and grafts: Secondary | ICD-10-CM | POA: Diagnosis not present

## 2023-11-15 DIAGNOSIS — R739 Hyperglycemia, unspecified: Secondary | ICD-10-CM | POA: Diagnosis not present

## 2023-11-16 DIAGNOSIS — R739 Hyperglycemia, unspecified: Secondary | ICD-10-CM | POA: Diagnosis not present

## 2023-11-16 DIAGNOSIS — J9811 Atelectasis: Secondary | ICD-10-CM | POA: Diagnosis not present

## 2023-11-16 DIAGNOSIS — Q249 Congenital malformation of heart, unspecified: Secondary | ICD-10-CM | POA: Diagnosis not present

## 2023-11-16 DIAGNOSIS — Z794 Long term (current) use of insulin: Secondary | ICD-10-CM | POA: Diagnosis not present

## 2023-11-16 DIAGNOSIS — Z952 Presence of prosthetic heart valve: Secondary | ICD-10-CM | POA: Diagnosis not present

## 2023-11-16 DIAGNOSIS — J4489 Other specified chronic obstructive pulmonary disease: Secondary | ICD-10-CM | POA: Diagnosis not present

## 2023-11-17 DIAGNOSIS — G4733 Obstructive sleep apnea (adult) (pediatric): Secondary | ICD-10-CM | POA: Diagnosis not present

## 2023-11-17 DIAGNOSIS — J45909 Unspecified asthma, uncomplicated: Secondary | ICD-10-CM | POA: Diagnosis not present

## 2023-11-17 DIAGNOSIS — Z952 Presence of prosthetic heart valve: Secondary | ICD-10-CM | POA: Diagnosis not present

## 2023-11-17 DIAGNOSIS — Q249 Congenital malformation of heart, unspecified: Secondary | ICD-10-CM | POA: Diagnosis not present

## 2023-11-18 DIAGNOSIS — Q228 Other congenital malformations of tricuspid valve: Secondary | ICD-10-CM | POA: Diagnosis not present

## 2023-11-18 DIAGNOSIS — Z954 Presence of other heart-valve replacement: Secondary | ICD-10-CM | POA: Diagnosis not present

## 2023-11-18 DIAGNOSIS — Q210 Ventricular septal defect: Secondary | ICD-10-CM | POA: Diagnosis not present

## 2023-11-18 DIAGNOSIS — J45909 Unspecified asthma, uncomplicated: Secondary | ICD-10-CM | POA: Diagnosis not present

## 2023-11-18 DIAGNOSIS — Z952 Presence of prosthetic heart valve: Secondary | ICD-10-CM | POA: Diagnosis not present

## 2023-11-18 DIAGNOSIS — G4733 Obstructive sleep apnea (adult) (pediatric): Secondary | ICD-10-CM | POA: Diagnosis not present

## 2023-11-18 DIAGNOSIS — Q249 Congenital malformation of heart, unspecified: Secondary | ICD-10-CM | POA: Diagnosis not present

## 2023-11-19 DIAGNOSIS — Z952 Presence of prosthetic heart valve: Secondary | ICD-10-CM | POA: Diagnosis not present

## 2023-11-19 DIAGNOSIS — Q213 Tetralogy of Fallot: Secondary | ICD-10-CM | POA: Diagnosis not present

## 2023-11-19 DIAGNOSIS — I4510 Unspecified right bundle-branch block: Secondary | ICD-10-CM | POA: Diagnosis not present

## 2023-11-19 DIAGNOSIS — Z8774 Personal history of (corrected) congenital malformations of heart and circulatory system: Secondary | ICD-10-CM | POA: Diagnosis not present

## 2023-11-20 DIAGNOSIS — Q213 Tetralogy of Fallot: Secondary | ICD-10-CM | POA: Diagnosis not present

## 2023-11-20 DIAGNOSIS — Z952 Presence of prosthetic heart valve: Secondary | ICD-10-CM | POA: Diagnosis not present

## 2023-11-20 DIAGNOSIS — Z8774 Personal history of (corrected) congenital malformations of heart and circulatory system: Secondary | ICD-10-CM | POA: Diagnosis not present

## 2023-11-21 DIAGNOSIS — Q213 Tetralogy of Fallot: Secondary | ICD-10-CM | POA: Diagnosis not present

## 2023-11-21 DIAGNOSIS — D821 Di George's syndrome: Secondary | ICD-10-CM | POA: Diagnosis not present

## 2023-11-21 DIAGNOSIS — I5020 Unspecified systolic (congestive) heart failure: Secondary | ICD-10-CM | POA: Diagnosis not present

## 2023-11-23 DIAGNOSIS — Z9981 Dependence on supplemental oxygen: Secondary | ICD-10-CM | POA: Diagnosis not present

## 2023-11-23 DIAGNOSIS — F419 Anxiety disorder, unspecified: Secondary | ICD-10-CM | POA: Diagnosis not present

## 2023-11-23 DIAGNOSIS — Z7901 Long term (current) use of anticoagulants: Secondary | ICD-10-CM | POA: Diagnosis not present

## 2023-11-23 DIAGNOSIS — D821 Di George's syndrome: Secondary | ICD-10-CM | POA: Diagnosis not present

## 2023-11-23 DIAGNOSIS — I5022 Chronic systolic (congestive) heart failure: Secondary | ICD-10-CM | POA: Diagnosis not present

## 2023-11-23 DIAGNOSIS — Z48812 Encounter for surgical aftercare following surgery on the circulatory system: Secondary | ICD-10-CM | POA: Diagnosis not present

## 2023-11-23 DIAGNOSIS — Q245 Malformation of coronary vessels: Secondary | ICD-10-CM | POA: Diagnosis not present

## 2023-11-23 DIAGNOSIS — Z7982 Long term (current) use of aspirin: Secondary | ICD-10-CM | POA: Diagnosis not present

## 2023-11-23 DIAGNOSIS — Z5181 Encounter for therapeutic drug level monitoring: Secondary | ICD-10-CM | POA: Diagnosis not present

## 2023-11-23 DIAGNOSIS — I459 Conduction disorder, unspecified: Secondary | ICD-10-CM | POA: Diagnosis not present

## 2023-11-23 DIAGNOSIS — M419 Scoliosis, unspecified: Secondary | ICD-10-CM | POA: Diagnosis not present

## 2023-11-23 DIAGNOSIS — Z7951 Long term (current) use of inhaled steroids: Secondary | ICD-10-CM | POA: Diagnosis not present

## 2023-11-23 DIAGNOSIS — Z952 Presence of prosthetic heart valve: Secondary | ICD-10-CM | POA: Diagnosis not present

## 2023-11-23 DIAGNOSIS — I7121 Aneurysm of the ascending aorta, without rupture: Secondary | ICD-10-CM | POA: Diagnosis not present

## 2023-11-23 DIAGNOSIS — Z955 Presence of coronary angioplasty implant and graft: Secondary | ICD-10-CM | POA: Diagnosis not present

## 2023-11-23 DIAGNOSIS — I088 Other rheumatic multiple valve diseases: Secondary | ICD-10-CM | POA: Diagnosis not present

## 2023-11-25 DIAGNOSIS — D821 Di George's syndrome: Secondary | ICD-10-CM | POA: Diagnosis not present

## 2023-11-25 DIAGNOSIS — Z7982 Long term (current) use of aspirin: Secondary | ICD-10-CM | POA: Diagnosis not present

## 2023-11-25 DIAGNOSIS — Z9981 Dependence on supplemental oxygen: Secondary | ICD-10-CM | POA: Diagnosis not present

## 2023-11-25 DIAGNOSIS — I7121 Aneurysm of the ascending aorta, without rupture: Secondary | ICD-10-CM | POA: Diagnosis not present

## 2023-11-25 DIAGNOSIS — Z952 Presence of prosthetic heart valve: Secondary | ICD-10-CM | POA: Diagnosis not present

## 2023-11-25 DIAGNOSIS — F419 Anxiety disorder, unspecified: Secondary | ICD-10-CM | POA: Diagnosis not present

## 2023-11-25 DIAGNOSIS — I5022 Chronic systolic (congestive) heart failure: Secondary | ICD-10-CM | POA: Diagnosis not present

## 2023-11-25 DIAGNOSIS — Z7901 Long term (current) use of anticoagulants: Secondary | ICD-10-CM | POA: Diagnosis not present

## 2023-11-25 DIAGNOSIS — Z7951 Long term (current) use of inhaled steroids: Secondary | ICD-10-CM | POA: Diagnosis not present

## 2023-11-25 DIAGNOSIS — Z5181 Encounter for therapeutic drug level monitoring: Secondary | ICD-10-CM | POA: Diagnosis not present

## 2023-11-25 DIAGNOSIS — I088 Other rheumatic multiple valve diseases: Secondary | ICD-10-CM | POA: Diagnosis not present

## 2023-11-25 DIAGNOSIS — Z955 Presence of coronary angioplasty implant and graft: Secondary | ICD-10-CM | POA: Diagnosis not present

## 2023-11-25 DIAGNOSIS — M419 Scoliosis, unspecified: Secondary | ICD-10-CM | POA: Diagnosis not present

## 2023-11-25 DIAGNOSIS — Q245 Malformation of coronary vessels: Secondary | ICD-10-CM | POA: Diagnosis not present

## 2023-11-25 DIAGNOSIS — I459 Conduction disorder, unspecified: Secondary | ICD-10-CM | POA: Diagnosis not present

## 2023-11-25 DIAGNOSIS — Z48812 Encounter for surgical aftercare following surgery on the circulatory system: Secondary | ICD-10-CM | POA: Diagnosis not present

## 2023-11-28 DIAGNOSIS — Z48812 Encounter for surgical aftercare following surgery on the circulatory system: Secondary | ICD-10-CM | POA: Diagnosis not present

## 2023-11-28 DIAGNOSIS — Z7951 Long term (current) use of inhaled steroids: Secondary | ICD-10-CM | POA: Diagnosis not present

## 2023-11-28 DIAGNOSIS — Z952 Presence of prosthetic heart valve: Secondary | ICD-10-CM | POA: Diagnosis not present

## 2023-11-28 DIAGNOSIS — F419 Anxiety disorder, unspecified: Secondary | ICD-10-CM | POA: Diagnosis not present

## 2023-11-28 DIAGNOSIS — D821 Di George's syndrome: Secondary | ICD-10-CM | POA: Diagnosis not present

## 2023-11-28 DIAGNOSIS — I459 Conduction disorder, unspecified: Secondary | ICD-10-CM | POA: Diagnosis not present

## 2023-11-28 DIAGNOSIS — Z5181 Encounter for therapeutic drug level monitoring: Secondary | ICD-10-CM | POA: Diagnosis not present

## 2023-11-28 DIAGNOSIS — Z7982 Long term (current) use of aspirin: Secondary | ICD-10-CM | POA: Diagnosis not present

## 2023-11-28 DIAGNOSIS — I088 Other rheumatic multiple valve diseases: Secondary | ICD-10-CM | POA: Diagnosis not present

## 2023-11-28 DIAGNOSIS — Z9981 Dependence on supplemental oxygen: Secondary | ICD-10-CM | POA: Diagnosis not present

## 2023-11-28 DIAGNOSIS — Z955 Presence of coronary angioplasty implant and graft: Secondary | ICD-10-CM | POA: Diagnosis not present

## 2023-11-28 DIAGNOSIS — Q245 Malformation of coronary vessels: Secondary | ICD-10-CM | POA: Diagnosis not present

## 2023-11-28 DIAGNOSIS — I5022 Chronic systolic (congestive) heart failure: Secondary | ICD-10-CM | POA: Diagnosis not present

## 2023-11-28 DIAGNOSIS — Z7901 Long term (current) use of anticoagulants: Secondary | ICD-10-CM | POA: Diagnosis not present

## 2023-11-28 DIAGNOSIS — M419 Scoliosis, unspecified: Secondary | ICD-10-CM | POA: Diagnosis not present

## 2023-11-28 DIAGNOSIS — I7121 Aneurysm of the ascending aorta, without rupture: Secondary | ICD-10-CM | POA: Diagnosis not present

## 2023-11-30 DIAGNOSIS — I5020 Unspecified systolic (congestive) heart failure: Secondary | ICD-10-CM | POA: Diagnosis not present

## 2023-11-30 DIAGNOSIS — Q213 Tetralogy of Fallot: Secondary | ICD-10-CM | POA: Diagnosis not present

## 2023-12-01 DIAGNOSIS — D821 Di George's syndrome: Secondary | ICD-10-CM | POA: Diagnosis not present

## 2023-12-01 DIAGNOSIS — Z9981 Dependence on supplemental oxygen: Secondary | ICD-10-CM | POA: Diagnosis not present

## 2023-12-01 DIAGNOSIS — M419 Scoliosis, unspecified: Secondary | ICD-10-CM | POA: Diagnosis not present

## 2023-12-01 DIAGNOSIS — I7121 Aneurysm of the ascending aorta, without rupture: Secondary | ICD-10-CM | POA: Diagnosis not present

## 2023-12-01 DIAGNOSIS — Z7982 Long term (current) use of aspirin: Secondary | ICD-10-CM | POA: Diagnosis not present

## 2023-12-01 DIAGNOSIS — F419 Anxiety disorder, unspecified: Secondary | ICD-10-CM | POA: Diagnosis not present

## 2023-12-01 DIAGNOSIS — Z7951 Long term (current) use of inhaled steroids: Secondary | ICD-10-CM | POA: Diagnosis not present

## 2023-12-01 DIAGNOSIS — I459 Conduction disorder, unspecified: Secondary | ICD-10-CM | POA: Diagnosis not present

## 2023-12-01 DIAGNOSIS — Z5181 Encounter for therapeutic drug level monitoring: Secondary | ICD-10-CM | POA: Diagnosis not present

## 2023-12-01 DIAGNOSIS — Z955 Presence of coronary angioplasty implant and graft: Secondary | ICD-10-CM | POA: Diagnosis not present

## 2023-12-01 DIAGNOSIS — I088 Other rheumatic multiple valve diseases: Secondary | ICD-10-CM | POA: Diagnosis not present

## 2023-12-01 DIAGNOSIS — Z952 Presence of prosthetic heart valve: Secondary | ICD-10-CM | POA: Diagnosis not present

## 2023-12-01 DIAGNOSIS — Q245 Malformation of coronary vessels: Secondary | ICD-10-CM | POA: Diagnosis not present

## 2023-12-01 DIAGNOSIS — Z7901 Long term (current) use of anticoagulants: Secondary | ICD-10-CM | POA: Diagnosis not present

## 2023-12-01 DIAGNOSIS — Z48812 Encounter for surgical aftercare following surgery on the circulatory system: Secondary | ICD-10-CM | POA: Diagnosis not present

## 2023-12-01 DIAGNOSIS — I5022 Chronic systolic (congestive) heart failure: Secondary | ICD-10-CM | POA: Diagnosis not present

## 2023-12-06 DIAGNOSIS — R9431 Abnormal electrocardiogram [ECG] [EKG]: Secondary | ICD-10-CM | POA: Diagnosis not present

## 2023-12-06 DIAGNOSIS — Z952 Presence of prosthetic heart valve: Secondary | ICD-10-CM | POA: Diagnosis not present

## 2023-12-20 DIAGNOSIS — Q245 Malformation of coronary vessels: Secondary | ICD-10-CM | POA: Diagnosis not present

## 2023-12-20 DIAGNOSIS — Z952 Presence of prosthetic heart valve: Secondary | ICD-10-CM | POA: Diagnosis not present

## 2023-12-20 DIAGNOSIS — I5082 Biventricular heart failure: Secondary | ICD-10-CM | POA: Diagnosis not present

## 2023-12-20 DIAGNOSIS — I5020 Unspecified systolic (congestive) heart failure: Secondary | ICD-10-CM | POA: Diagnosis not present

## 2023-12-20 DIAGNOSIS — I071 Rheumatic tricuspid insufficiency: Secondary | ICD-10-CM | POA: Diagnosis not present

## 2023-12-20 DIAGNOSIS — Q213 Tetralogy of Fallot: Secondary | ICD-10-CM | POA: Diagnosis not present

## 2023-12-20 DIAGNOSIS — I371 Nonrheumatic pulmonary valve insufficiency: Secondary | ICD-10-CM | POA: Diagnosis not present

## 2023-12-20 DIAGNOSIS — D821 Di George's syndrome: Secondary | ICD-10-CM | POA: Diagnosis not present

## 2023-12-20 DIAGNOSIS — Q210 Ventricular septal defect: Secondary | ICD-10-CM | POA: Diagnosis not present

## 2023-12-22 DIAGNOSIS — Q249 Congenital malformation of heart, unspecified: Secondary | ICD-10-CM | POA: Diagnosis not present

## 2023-12-22 DIAGNOSIS — Z952 Presence of prosthetic heart valve: Secondary | ICD-10-CM | POA: Diagnosis not present

## 2023-12-28 DIAGNOSIS — G4733 Obstructive sleep apnea (adult) (pediatric): Secondary | ICD-10-CM | POA: Diagnosis not present

## 2023-12-28 DIAGNOSIS — Z7189 Other specified counseling: Secondary | ICD-10-CM | POA: Diagnosis not present

## 2024-02-15 DIAGNOSIS — Z713 Dietary counseling and surveillance: Secondary | ICD-10-CM | POA: Diagnosis not present

## 2024-03-01 ENCOUNTER — Telehealth (HOSPITAL_COMMUNITY): Payer: Self-pay

## 2024-03-01 DIAGNOSIS — I5020 Unspecified systolic (congestive) heart failure: Secondary | ICD-10-CM | POA: Diagnosis not present

## 2024-03-01 DIAGNOSIS — I071 Rheumatic tricuspid insufficiency: Secondary | ICD-10-CM | POA: Diagnosis not present

## 2024-03-01 DIAGNOSIS — J45909 Unspecified asthma, uncomplicated: Secondary | ICD-10-CM | POA: Diagnosis not present

## 2024-03-01 DIAGNOSIS — I371 Nonrheumatic pulmonary valve insufficiency: Secondary | ICD-10-CM | POA: Diagnosis not present

## 2024-03-01 DIAGNOSIS — Z952 Presence of prosthetic heart valve: Secondary | ICD-10-CM | POA: Diagnosis not present

## 2024-03-01 DIAGNOSIS — I351 Nonrheumatic aortic (valve) insufficiency: Secondary | ICD-10-CM | POA: Diagnosis not present

## 2024-03-01 DIAGNOSIS — Q213 Tetralogy of Fallot: Secondary | ICD-10-CM | POA: Diagnosis not present

## 2024-03-01 DIAGNOSIS — D821 Di George's syndrome: Secondary | ICD-10-CM | POA: Diagnosis not present

## 2024-03-01 DIAGNOSIS — Q210 Ventricular septal defect: Secondary | ICD-10-CM | POA: Diagnosis not present

## 2024-03-01 DIAGNOSIS — I5082 Biventricular heart failure: Secondary | ICD-10-CM | POA: Diagnosis not present

## 2024-03-01 NOTE — Telephone Encounter (Signed)
 Courtney Mckay with [facility] called in regards to CR, adv her of note below. Verified fax number and she stated she will fax pt recent 12 EKG/tracing.

## 2024-03-02 ENCOUNTER — Telehealth (HOSPITAL_COMMUNITY): Payer: Self-pay

## 2024-03-02 NOTE — Telephone Encounter (Signed)
 Called and spoke with pt who gave me verbal consent to talk with her mother Courtney Mckay. Courtney Mckay and pt stated she is due to start a new job and due to work schedule, not able to participate at this time.   Closed referral

## 2024-03-02 NOTE — Telephone Encounter (Signed)
 Pt insurance is active and benefits verified through [employer]. Co-pay $0.00, DED $1,800.00/$1,800.00 met, out of pocket $9,100.00/$9,100.00 met, co-insurance 30%. No pre-authorization required. [employer], 03/02/24 @ 3:14PM, REF#[id number]   How many CR sessions are covered? (36 visits for TCR, 72 visits for ICR)72 Is this a lifetime maximum or an annual maximum? Annual Has the member used any of these services to date? No Is there a time limit (weeks/months) on start of program and/or program completion? No

## 2024-03-15 DIAGNOSIS — G4733 Obstructive sleep apnea (adult) (pediatric): Secondary | ICD-10-CM | POA: Diagnosis not present

## 2024-04-02 DIAGNOSIS — G4733 Obstructive sleep apnea (adult) (pediatric): Secondary | ICD-10-CM | POA: Diagnosis not present

## 2024-04-02 DIAGNOSIS — J984 Other disorders of lung: Secondary | ICD-10-CM | POA: Diagnosis not present

## 2024-04-02 DIAGNOSIS — J4489 Other specified chronic obstructive pulmonary disease: Secondary | ICD-10-CM | POA: Diagnosis not present

## 2024-04-02 DIAGNOSIS — B370 Candidal stomatitis: Secondary | ICD-10-CM | POA: Diagnosis not present

## 2024-04-15 DIAGNOSIS — G4733 Obstructive sleep apnea (adult) (pediatric): Secondary | ICD-10-CM | POA: Diagnosis not present

## 2024-06-06 DIAGNOSIS — F419 Anxiety disorder, unspecified: Secondary | ICD-10-CM | POA: Diagnosis not present

## 2024-06-06 DIAGNOSIS — G4733 Obstructive sleep apnea (adult) (pediatric): Secondary | ICD-10-CM | POA: Diagnosis not present

## 2024-06-06 DIAGNOSIS — J4489 Other specified chronic obstructive pulmonary disease: Secondary | ICD-10-CM | POA: Diagnosis not present

## 2024-06-06 DIAGNOSIS — Z789 Other specified health status: Secondary | ICD-10-CM | POA: Diagnosis not present

## 2024-09-05 DIAGNOSIS — I5022 Chronic systolic (congestive) heart failure: Secondary | ICD-10-CM | POA: Diagnosis not present

## 2024-09-05 DIAGNOSIS — Q213 Tetralogy of Fallot: Secondary | ICD-10-CM | POA: Diagnosis not present

## 2024-09-05 DIAGNOSIS — I5020 Unspecified systolic (congestive) heart failure: Secondary | ICD-10-CM | POA: Diagnosis not present

## 2024-09-05 DIAGNOSIS — D821 Di George's syndrome: Secondary | ICD-10-CM | POA: Diagnosis not present

## 2024-09-07 DIAGNOSIS — Z8774 Personal history of (corrected) congenital malformations of heart and circulatory system: Secondary | ICD-10-CM | POA: Diagnosis not present

## 2024-09-07 DIAGNOSIS — Z124 Encounter for screening for malignant neoplasm of cervix: Secondary | ICD-10-CM | POA: Diagnosis not present

## 2024-09-07 DIAGNOSIS — I371 Nonrheumatic pulmonary valve insufficiency: Secondary | ICD-10-CM | POA: Diagnosis not present

## 2024-09-07 DIAGNOSIS — Z952 Presence of prosthetic heart valve: Secondary | ICD-10-CM | POA: Diagnosis not present

## 2024-09-07 DIAGNOSIS — Z01419 Encounter for gynecological examination (general) (routine) without abnormal findings: Secondary | ICD-10-CM | POA: Diagnosis not present

## 2024-09-07 DIAGNOSIS — Z681 Body mass index (BMI) 19 or less, adult: Secondary | ICD-10-CM | POA: Diagnosis not present

## 2024-09-07 DIAGNOSIS — I071 Rheumatic tricuspid insufficiency: Secondary | ICD-10-CM | POA: Diagnosis not present
